# Patient Record
Sex: Male | Born: 1940 | Race: Black or African American | Hispanic: No | Marital: Single | Smoking: Former smoker
Health system: Southern US, Community
[De-identification: ages and names within clinical notes are randomized; demographics above are authoritative.]

## PROBLEM LIST (undated history)

## (undated) DIAGNOSIS — C801 Malignant (primary) neoplasm, unspecified: Secondary | ICD-10-CM

## (undated) DIAGNOSIS — I10 Essential (primary) hypertension: Secondary | ICD-10-CM

## (undated) DIAGNOSIS — K219 Gastro-esophageal reflux disease without esophagitis: Secondary | ICD-10-CM

## (undated) DIAGNOSIS — I2510 Atherosclerotic heart disease of native coronary artery without angina pectoris: Secondary | ICD-10-CM

---

## 2014-07-19 ENCOUNTER — Encounter (HOSPITAL_COMMUNITY): Payer: Self-pay | Admitting: Emergency Medicine

## 2014-07-19 ENCOUNTER — Emergency Department (HOSPITAL_COMMUNITY): Payer: Medicare Other

## 2014-07-19 ENCOUNTER — Emergency Department (HOSPITAL_COMMUNITY)
Admission: EM | Admit: 2014-07-19 | Discharge: 2014-07-19 | Disposition: A | Discharge status: Home / Self Care | Payer: Medicare Other | Attending: Emergency Medicine | Admitting: Emergency Medicine

## 2014-07-19 DIAGNOSIS — M545 Low back pain, unspecified: Secondary | ICD-10-CM

## 2014-07-19 DIAGNOSIS — I2510 Atherosclerotic heart disease of native coronary artery without angina pectoris: Secondary | ICD-10-CM | POA: Insufficient documentation

## 2014-07-19 DIAGNOSIS — R42 Dizziness and giddiness: Secondary | ICD-10-CM | POA: Insufficient documentation

## 2014-07-19 DIAGNOSIS — Z8719 Personal history of other diseases of the digestive system: Secondary | ICD-10-CM | POA: Insufficient documentation

## 2014-07-19 DIAGNOSIS — I10 Essential (primary) hypertension: Secondary | ICD-10-CM | POA: Insufficient documentation

## 2014-07-19 DIAGNOSIS — E860 Dehydration: Secondary | ICD-10-CM

## 2014-07-19 DIAGNOSIS — Z8546 Personal history of malignant neoplasm of prostate: Secondary | ICD-10-CM | POA: Insufficient documentation

## 2014-07-19 DIAGNOSIS — M549 Dorsalgia, unspecified: Secondary | ICD-10-CM | POA: Insufficient documentation

## 2014-07-19 DIAGNOSIS — Z85830 Personal history of malignant neoplasm of bone: Secondary | ICD-10-CM | POA: Insufficient documentation

## 2014-07-19 DIAGNOSIS — Z87891 Personal history of nicotine dependence: Secondary | ICD-10-CM | POA: Insufficient documentation

## 2014-07-19 DIAGNOSIS — Z8505 Personal history of malignant neoplasm of liver: Secondary | ICD-10-CM | POA: Insufficient documentation

## 2014-07-19 DIAGNOSIS — R109 Unspecified abdominal pain: Secondary | ICD-10-CM | POA: Insufficient documentation

## 2014-07-19 HISTORY — DX: Atherosclerotic heart disease of native coronary artery without angina pectoris: I25.10

## 2014-07-19 HISTORY — DX: Malignant (primary) neoplasm, unspecified: C80.1

## 2014-07-19 HISTORY — DX: Essential (primary) hypertension: I10

## 2014-07-19 HISTORY — DX: Gastro-esophageal reflux disease without esophagitis: K21.9

## 2014-07-19 LAB — URINALYSIS, ROUTINE W REFLEX MICROSCOPIC
BILIRUBIN URINE: NEGATIVE
Glucose, UA: NEGATIVE mg/dL
Hgb urine dipstick: NEGATIVE
KETONES UR: NEGATIVE mg/dL
LEUKOCYTES UA: NEGATIVE
NITRITE: NEGATIVE
PROTEIN: NEGATIVE mg/dL
Specific Gravity, Urine: 1.018 (ref 1.005–1.030)
Urobilinogen, UA: 1 mg/dL (ref 0.0–1.0)
pH: 6.5 (ref 5.0–8.0)

## 2014-07-19 LAB — COMPREHENSIVE METABOLIC PANEL
ALT: 8 U/L (ref 0–53)
AST: 34 U/L (ref 0–37)
Albumin: 2.2 g/dL — ABNORMAL LOW (ref 3.5–5.2)
Alkaline Phosphatase: 151 U/L — ABNORMAL HIGH (ref 39–117)
Anion gap: 14 (ref 5–15)
BUN: 7 mg/dL (ref 6–23)
CALCIUM: 8.8 mg/dL (ref 8.4–10.5)
CHLORIDE: 99 meq/L (ref 96–112)
CO2: 25 meq/L (ref 19–32)
Creatinine, Ser: 0.68 mg/dL (ref 0.50–1.35)
GFR calc Af Amer: >90 mL/min (ref >90)
Glucose, Bld: 98 mg/dL (ref 70–99)
Potassium: 3.7 mEq/L (ref 3.7–5.3)
Sodium: 138 mEq/L (ref 137–147)
Total Bilirubin: 0.2 mg/dL — ABNORMAL LOW (ref 0.3–1.2)
Total Protein: 7 g/dL (ref 6.0–8.3)

## 2014-07-19 LAB — CBC WITH DIFFERENTIAL/PLATELET
BASOS ABS: 0 10*3/uL (ref 0.0–0.1)
Basophils Relative: 0 % (ref 0–1)
EOS PCT: 1 % (ref 0–5)
Eosinophils Absolute: 0 10*3/uL (ref 0.0–0.7)
HEMATOCRIT: 30.2 % — AB (ref 39.0–52.0)
HEMOGLOBIN: 9.6 g/dL — AB (ref 13.0–17.0)
Lymphocytes Relative: 27 % (ref 12–46)
Lymphs Abs: 1.4 10*3/uL (ref 0.7–4.0)
MCH: 26.6 pg (ref 26.0–34.0)
MCHC: 31.8 g/dL (ref 30.0–36.0)
MCV: 83.7 fL (ref 78.0–100.0)
MONO ABS: 0.4 10*3/uL (ref 0.1–1.0)
Monocytes Relative: 8 % (ref 3–12)
Neutro Abs: 3.2 10*3/uL (ref 1.7–7.7)
Neutrophils Relative %: 64 % (ref 43–77)
Platelets: 322 10*3/uL (ref 150–400)
RBC: 3.61 MIL/uL — ABNORMAL LOW (ref 4.22–5.81)
RDW: 15.7 % — AB (ref 11.5–15.5)
WBC: 5.1 10*3/uL (ref 4.0–10.5)

## 2014-07-19 LAB — I-STAT TROPONIN, ED: Troponin i, poc: 0.06 ng/mL (ref 0.00–0.08)

## 2014-07-19 LAB — LIPASE, BLOOD: LIPASE: 22 U/L (ref 11–59)

## 2014-07-19 MED ORDER — SODIUM CHLORIDE 0.9 % IV BOLUS (SEPSIS)
1000.0000 mL | Freq: Once | INTRAVENOUS | Status: AC
  Administered 2014-07-19: 1000 mL via INTRAVENOUS

## 2014-07-19 MED ORDER — HYDROCODONE-ACETAMINOPHEN 5-325 MG PO TABS: 1.0000 | ORAL_TABLET | ORAL | Status: AC | PRN

## 2014-07-19 MED ORDER — HYDROCODONE-ACETAMINOPHEN 5-325 MG PO TABS
1.0000 | ORAL_TABLET | Freq: Once | ORAL | Status: AC
  Administered 2014-07-19: 1 via ORAL
  Filled 2014-07-19: qty 1

## 2014-07-19 NOTE — ED Notes (Signed)
IV team at bedside 

## 2014-07-19 NOTE — ED Notes (Signed)
Pt c/o lower back pain x 3 days. Pt also c/o weakness and slight dizziness thinks its because he has not had a BM in 4 days. Denies n/v

## 2014-07-19 NOTE — Discharge Instructions (Signed)
Please continue your regular medications. Take norco for severe pain as prescribed as needed. Follow up with the wellness center as referred. Return if symptoms are worsening.   Dehydration, Adult Dehydration is when you lose more fluids from the body than you take in. Vital organs like the kidneys, brain, and heart cannot function without a proper amount of fluids and salt. Any loss of fluids from the body can cause dehydration.  CAUSES   Vomiting.  Diarrhea.  Excessive sweating.  Excessive urine output.  Fever. SYMPTOMS  Mild dehydration  Thirst.  Dry lips.  Slightly dry mouth. Moderate dehydration  Very dry mouth.  Sunken eyes.  Skin does not bounce back quickly when lightly pinched and released.  Dark urine and decreased urine production.  Decreased tear production.  Headache. Severe dehydration  Very dry mouth.  Extreme thirst.  Rapid, weak pulse (more than 100 beats per minute at rest).  Cold hands and feet.  Not able to sweat in spite of heat and temperature.  Rapid breathing.  Blue lips.  Confusion and lethargy.  Difficulty being awakened.  Minimal urine production.  No tears. DIAGNOSIS  Your caregiver will diagnose dehydration based on your symptoms and your exam. Blood and urine tests will help confirm the diagnosis. The diagnostic evaluation should also identify the cause of dehydration. TREATMENT  Treatment of mild or moderate dehydration can often be done at home by increasing the amount of fluids that you drink. It is best to drink small amounts of fluid more often. Drinking too much at one time can make vomiting worse. Refer to the home care instructions below. Severe dehydration needs to be treated at the hospital where you will probably be given intravenous (IV) fluids that contain water and electrolytes. HOME CARE INSTRUCTIONS   Ask your caregiver about specific rehydration instructions.  Drink enough fluids to keep your urine  clear or pale yellow.  Drink small amounts frequently if you have nausea and vomiting.  Eat as you normally do.  Avoid:  Foods or drinks high in sugar.  Carbonated drinks.  Juice.  Extremely hot or cold fluids.  Drinks with caffeine.  Fatty, greasy foods.  Alcohol.  Tobacco.  Overeating.  Gelatin desserts.  Wash your hands well to avoid spreading bacteria and viruses.  Only take over-the-counter or prescription medicines for pain, discomfort, or fever as directed by your caregiver.  Ask your caregiver if you should continue all prescribed and over-the-counter medicines.  Keep all follow-up appointments with your caregiver. SEEK MEDICAL CARE IF:  You have abdominal pain and it increases or stays in one area (localizes).  You have a rash, stiff neck, or severe headache.  You are irritable, sleepy, or difficult to awaken.  You are weak, dizzy, or extremely thirsty. SEEK IMMEDIATE MEDICAL CARE IF:   You are unable to keep fluids down or you get worse despite treatment.  You have frequent episodes of vomiting or diarrhea.  You have blood or green matter (bile) in your vomit.  You have blood in your stool or your stool looks black and tarry.  You have not urinated in 6 to 8 hours, or you have only urinated a small amount of very dark urine.  You have a fever.  You faint. MAKE SURE YOU:   Understand these instructions.  Will watch your condition.  Will get help right away if you are not doing well or get worse. Document Released: 12/02/2005 Document Revised: 02/24/2012 Document Reviewed: 07/22/2011 [employer] Patient Information 2015 [employer]. This information is not intended to replace advice given to you by your health care provider. Make sure you discuss any questions you have with your health care provider.

## 2014-07-19 NOTE — ED Provider Notes (Signed)
CSN: [id number]     Arrival date & time 07/19/14  1041 History   First MD Initiated Contact with Patient 07/19/14 1101     Chief Complaint  Patient presents with  . Weakness  . Dizziness  . Back Pain     (Consider location/radiation/quality/duration/timing/severity/associated sxs/prior Treatment) HPI Barry Macias is a 73 y.o. male who presents to ED complaining of dizziness, weakness, back pain. Pt just moved here from [place], currently staying with a friend. Pt reports hx of CAD, metastatic ca. From records that he has with him, pt had PCI 2 months ago, and was just discharged 5 days ago after being admitted for chest pain. Stress test performed and was negative, CT angio was done which showed metastatic lesions in thoracic spine, ribs, liver. Pt states he is currently not taking any medications, even his heart medications that he was discharged on. He reports poor appetite. States dizziness feels like near syncope and worsened with standing. Denies any fever, chills, n/v/d. Intermittent epigastric pain, hx of gerd. Pt also reports hx of constipation, last BM 4 days ago.   Past Medical History  Diagnosis Date  . Hypertension   . CAD (coronary artery disease)   . GERD (gastroesophageal reflux disease)   . Cancer     metastasis to bone   History reviewed. No pertinent past surgical history. No family history on file. History  Substance Use Topics  . Smoking status: Former Research scientist (life sciences)  . Smokeless tobacco: Not on file  . Alcohol Use: No    Review of Systems  Constitutional: Negative for fever and chills.  Respiratory: Negative for cough, chest tightness and shortness of breath.   Cardiovascular: Negative for chest pain, palpitations and leg swelling.  Gastrointestinal: Positive for abdominal pain. Negative for nausea, vomiting, diarrhea and abdominal distention.  Genitourinary: Negative for dysuria, urgency, frequency and hematuria.  Musculoskeletal: Positive for back pain. Negative for  arthralgias, myalgias, neck pain and neck stiffness.  Skin: Negative for rash.  Allergic/Immunologic: Negative for immunocompromised state.  Neurological: Positive for dizziness and light-headedness. Negative for weakness, numbness and headaches.      Allergies  Review of patient's allergies indicates no known allergies.  Home Medications   Prior to Admission medications   Not on File   BP 119/60  Pulse 110  Temp(Src) 98.5 F (36.9 C) (Oral)  Resp 20  SpO2 99% Physical Exam  Nursing note and vitals reviewed. Constitutional: He appears well-developed and well-nourished. No distress.  HENT:  Head: Normocephalic and atraumatic.  Oral mucosa dry  Eyes: Conjunctivae and EOM are normal. Pupils are equal, round, and reactive to light.  Neck: Normal range of motion. Neck supple.  Cardiovascular: Normal rate, regular rhythm and normal heart sounds.   Pulmonary/Chest: Effort normal. No respiratory distress. He has no wheezes. He has no rales.  Abdominal: Soft. Bowel sounds are normal. He exhibits no distension and no mass. There is no tenderness. There is no rebound and no guarding.  Musculoskeletal: He exhibits no edema.  Left perispinal lumbar tenderness. No midline thoracic or lumbar spine tenderness. Tender in left Si joint. No pain with bilateral straight leg raise .  Neurological: He is alert.  5/5 and equal lower extremity strength. 2+ and equal patellar reflexes bilaterally. Pt able to dorsiflex bilateral toes and feet with good strength against resistance. Equal sensation bilaterally over thighs and lower legs.   Skin: Skin is warm and dry.    ED Course  Procedures (including critical care time) Labs Review Labs  Reviewed  CBC WITH DIFFERENTIAL - Abnormal; Notable for the following:    RBC 3.61 (*)    Hemoglobin 9.6 (*)    HCT 30.2 (*)    RDW 15.7 (*)    All other components within normal limits  COMPREHENSIVE METABOLIC PANEL - Abnormal; Notable for the following:     Albumin 2.2 (*)    Alkaline Phosphatase 151 (*)    Total Bilirubin 0.2 (*)    All other components within normal limits  URINALYSIS, ROUTINE W REFLEX MICROSCOPIC - Abnormal; Notable for the following:    Color, Urine AMBER (*)    All other components within normal limits  LIPASE, BLOOD  I-STAT TROPOININ, ED    Imaging Review Dg Abd Acute W/chest  07/19/2014   CLINICAL DATA:  Pain.  EXAM: ACUTE ABDOMEN SERIES (ABDOMEN 2 VIEW & CHEST 1 VIEW)  COMPARISON:  None.  FINDINGS: No acute cardiopulmonary disease. Soft tissue structures are unremarkable. Scattered air-fluid levels are noted throughout the abdomen. Air-filled minimally prominent loops of small bowel are noted. Colonic gas pattern is nonspecific. No free air. Calcifications within the pelvis consistent with phleboliths. Degenerative changes lumbar spine and both hips.  IMPRESSION: 1. Scattered air-fluid levels with mild small bowel prominence. Colonic gas pattern nonspecific. Findings are most consistent with mild adynamic ileus. Follow-up abdominal series to exclude developing small bowel obstruction should be considered. 2. No acute cardiopulmonary disease.   Electronically Signed   By: Marcello Moores  Register   On: 07/19/2014 12:51     EKG Interpretation   Date/Time:  Tuesday July 19 2014 11:17:34 EDT Ventricular Rate:  103 PR Interval:  117 QRS Duration: 99 QT Interval:  348 QTC Calculation: 455 R Axis:   40 Text Interpretation:  Sinus tachycardia Probable anteroseptal infarct, old  Baseline wander in lead(s) V1 No old tracing to compare Confirmed by [facility]   MD, ELLIOTT ([id number]) on 07/19/2014 11:55:23 AM      MDM   Final diagnoses:  Left-sided low back pain without sciatica  Dehydration   Patient with back pain, dizziness, weakness. Based on the records patient has metastatic disease from what is it sounds like prostate cancer, metastases to the bone and liver. Patient chose not to receive any treatment at this time. Pt appears  dehydrated. Will start IV fluids, check orthostatics, labs   3:20 PM Pt was orthostatic. Received iv fluids. Feeling better. Labs unremarkable other than anemia. Recommended iron supplements. Pt is afebrile, no evidence of infection.  Abdomen soft, non tender at this time. Suspect back pain is muscular given his metastatic disease. No signs of cauda equina. Discussed with Dr. Eulis Foster who has seen pt. Will start on pain medications. Follow up with wellness center here in [place]. Return precautions discussed.   Filed Vitals:   07/19/14 1053 07/19/14 1215 07/19/14 1216 07/19/14 1333  BP: 119/60 141/65 103/51 140/73  Pulse: 110 104 113 98  Temp: 98.5 F (36.9 C)     TempSrc: Oral     Resp: 20 20 16 16   SpO2: 99% 100% 100%       Renold Genta, PA-C 07/19/14 1522

## 2014-07-19 NOTE — ED Notes (Signed)
Attempted to place IV X 2-IV RN called

## 2014-07-19 NOTE — ED Provider Notes (Signed)
  Face-to-face evaluation   History: Presents for evaluation of back pain. He has known metastatic cancer. He is in [place], now, staying with a friend and is not sure if he is her long-term or not.    Physical exam: Alert, cooperative, he is cachectic. Respiratory- no distress.  Medical screening examination/treatment/procedure(s) were conducted as a shared visit with non-physician practitioner(s) and myself.  I personally evaluated the patient during the encounter  Richarda Blade, MD 07/19/14 [phone]

## 2014-08-08 ENCOUNTER — Encounter (HOSPITAL_COMMUNITY): Payer: Self-pay | Admitting: Emergency Medicine

## 2014-08-08 DIAGNOSIS — R143 Flatulence: Principal | ICD-10-CM

## 2014-08-08 DIAGNOSIS — Z87891 Personal history of nicotine dependence: Secondary | ICD-10-CM | POA: Insufficient documentation

## 2014-08-08 DIAGNOSIS — Z79899 Other long term (current) drug therapy: Secondary | ICD-10-CM | POA: Insufficient documentation

## 2014-08-08 DIAGNOSIS — Z85830 Personal history of malignant neoplasm of bone: Secondary | ICD-10-CM | POA: Insufficient documentation

## 2014-08-08 DIAGNOSIS — Z7901 Long term (current) use of anticoagulants: Secondary | ICD-10-CM | POA: Insufficient documentation

## 2014-08-08 DIAGNOSIS — K5900 Constipation, unspecified: Secondary | ICD-10-CM | POA: Insufficient documentation

## 2014-08-08 DIAGNOSIS — I2510 Atherosclerotic heart disease of native coronary artery without angina pectoris: Secondary | ICD-10-CM | POA: Insufficient documentation

## 2014-08-08 DIAGNOSIS — R141 Gas pain: Secondary | ICD-10-CM | POA: Insufficient documentation

## 2014-08-08 DIAGNOSIS — Z8546 Personal history of malignant neoplasm of prostate: Secondary | ICD-10-CM | POA: Insufficient documentation

## 2014-08-08 DIAGNOSIS — R142 Eructation: Principal | ICD-10-CM

## 2014-08-08 DIAGNOSIS — I10 Essential (primary) hypertension: Secondary | ICD-10-CM | POA: Insufficient documentation

## 2014-08-08 NOTE — ED Notes (Signed)
Patient here with complaint of constipation which has been ongoing for over a month. States that the constipation has been getting worse over the past 4 days. Explains that he has been using 1 bottle of Mag citrate/day with positive effect but the problem comes right back. States only able to eat small amount before feeling full. Denies nausea, vomiting, chest pain, abdominal pain, shortness of breath, and fever.

## 2014-08-09 ENCOUNTER — Emergency Department (HOSPITAL_COMMUNITY)
Admission: EM | Admit: 2014-08-09 | Discharge: 2014-08-09 | Disposition: A | Discharge status: Home / Self Care | Payer: Medicare Other | Attending: Emergency Medicine | Admitting: Emergency Medicine

## 2014-08-09 DIAGNOSIS — R140 Abdominal distension (gaseous): Secondary | ICD-10-CM

## 2014-08-09 LAB — URINALYSIS, ROUTINE W REFLEX MICROSCOPIC
Bilirubin Urine: NEGATIVE
Glucose, UA: NEGATIVE mg/dL
Hgb urine dipstick: NEGATIVE
KETONES UR: 15 mg/dL — AB
LEUKOCYTES UA: NEGATIVE
Nitrite: NEGATIVE
PROTEIN: 30 mg/dL — AB
Specific Gravity, Urine: 1.024 (ref 1.005–1.030)
Urobilinogen, UA: 0.2 mg/dL (ref 0.0–1.0)
pH: 6 (ref 5.0–8.0)

## 2014-08-09 LAB — URINE MICROSCOPIC-ADD ON

## 2014-08-09 LAB — COMPREHENSIVE METABOLIC PANEL
ALT: 12 U/L (ref 0–53)
AST: 38 U/L — ABNORMAL HIGH (ref 0–37)
Albumin: 2.3 g/dL — ABNORMAL LOW (ref 3.5–5.2)
Alkaline Phosphatase: 280 U/L — ABNORMAL HIGH (ref 39–117)
Anion gap: 14 (ref 5–15)
BUN: 10 mg/dL (ref 6–23)
CALCIUM: 8.7 mg/dL (ref 8.4–10.5)
CO2: 25 meq/L (ref 19–32)
CREATININE: 0.75 mg/dL (ref 0.50–1.35)
Chloride: 96 mEq/L (ref 96–112)
GFR calc non Af Amer: >90 mL/min (ref >90)
Glucose, Bld: 111 mg/dL — ABNORMAL HIGH (ref 70–99)
Potassium: 4.4 mEq/L (ref 3.7–5.3)
SODIUM: 135 meq/L — AB (ref 137–147)
TOTAL PROTEIN: 7.7 g/dL (ref 6.0–8.3)
Total Bilirubin: 0.3 mg/dL (ref 0.3–1.2)

## 2014-08-09 LAB — CBC WITH DIFFERENTIAL/PLATELET
Basophils Absolute: 0 10*3/uL (ref 0.0–0.1)
Basophils Relative: 0 % (ref 0–1)
EOS ABS: 0 10*3/uL (ref 0.0–0.7)
EOS PCT: 0 % (ref 0–5)
HEMATOCRIT: 30.1 % — AB (ref 39.0–52.0)
Hemoglobin: 9.6 g/dL — ABNORMAL LOW (ref 13.0–17.0)
LYMPHS PCT: 28 % (ref 12–46)
Lymphs Abs: 1.5 10*3/uL (ref 0.7–4.0)
MCH: 26 pg (ref 26.0–34.0)
MCHC: 31.9 g/dL (ref 30.0–36.0)
MCV: 81.6 fL (ref 78.0–100.0)
MONO ABS: 0.4 10*3/uL (ref 0.1–1.0)
Monocytes Relative: 8 % (ref 3–12)
Neutro Abs: 3.4 10*3/uL (ref 1.7–7.7)
Neutrophils Relative %: 64 % (ref 43–77)
PLATELETS: 288 10*3/uL (ref 150–400)
RBC: 3.69 MIL/uL — ABNORMAL LOW (ref 4.22–5.81)
RDW: 17.2 % — AB (ref 11.5–15.5)
WBC: 5.3 10*3/uL (ref 4.0–10.5)

## 2014-08-09 LAB — LIPASE, BLOOD: Lipase: 14 U/L (ref 11–59)

## 2014-08-09 MED ORDER — SODIUM CHLORIDE 0.9 % IV BOLUS (SEPSIS)
1000.0000 mL | Freq: Once | INTRAVENOUS | Status: AC
  Administered 2014-08-09: 1000 mL via INTRAVENOUS

## 2014-08-09 NOTE — ED Notes (Signed)
The patient is unable to give an urine specimen at this time. The tech has reported to the RN in charge. 

## 2014-08-09 NOTE — ED Provider Notes (Signed)
CSN: [id number]     Arrival date & time 08/08/14  2135 History   First MD Initiated Contact with Patient 08/09/14 0203     Chief Complaint  Patient presents with  . Constipation  . Bloated     (Consider location/radiation/quality/duration/timing/severity/associated sxs/prior Treatment) HPI  Mr. 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 Patient recently moved here from [place] and has not established care with an oncologist. This is currently pending at [facility] upon approval of his insurance.  Patient states he can only eat very small meals before feeling bloated.  Patient denies any nausea or vomiting. He does have moderate abdominal pain after meals and early satiety.  He denies any changes in his stool or urine. He denies any fevers recent infections chest pain or shortness of breath. Patient states he also takes MiraLAX every day to help with his bowel movements.  Past Medical History  Diagnosis Date  . Hypertension   . CAD (coronary artery disease)   . GERD (gastroesophageal reflux disease)   . Cancer     metastasis to bone   History reviewed. No pertinent past surgical history. History reviewed. No pertinent family history. History  Substance Use Topics  . Smoking status: Former Research scientist (life sciences)  . Smokeless tobacco: Not on file  . Alcohol Use: No    Review of Systems 10 Systems reviewed and are negative for acute change except as noted in the HPI.    Allergies  Review of patient's allergies indicates no known allergies.  Home Medications   Prior to Admission medications   Medication Sig Start Date End Date Taking? Authorizing Provider  atorvastatin (LIPITOR) 40 MG tablet Take 40 mg by mouth daily.    Historical Provider, MD  clopidogrel (PLAVIX) 75 MG tablet Take 75 mg by mouth daily.    Historical Provider, MD  docusate sodium (COLACE) 100 MG capsule Take 100 mg  by mouth daily.    Historical Provider, MD  HYDROcodone-acetaminophen (NORCO/VICODIN) 5-325 MG per tablet Take 1 tablet by mouth every 4 (four) hours as needed for moderate pain or severe pain. 07/19/14   Tatyana A Kirichenko, PA-C  polyethylene glycol (MIRALAX / GLYCOLAX) packet Take 17 g by mouth daily as needed for moderate constipation.    Historical Provider, MD  senna (SENOKOT) 8.6 MG tablet Take 1 tablet by mouth daily.    Historical Provider, MD   BP 132/67  Pulse 99  Temp(Src) 98 F (36.7 C) (Oral)  Resp 17  SpO2 99% Physical Exam  Nursing note and vitals reviewed. Constitutional: He is oriented to person, place, and time. Vital signs are normal.  Non-toxic appearance. He does not appear ill. No distress.  HENT:  Head: Normocephalic and atraumatic.  Nose: Nose normal.  Mouth/Throat: Oropharynx is clear and moist. No oropharyngeal exudate.  Eyes: Conjunctivae and EOM are normal. Pupils are equal, round, and reactive to light. No scleral icterus.  Neck: Normal range of motion. Neck supple. No tracheal deviation, no edema, no erythema and normal range of motion present. No mass and no thyromegaly present.  Cardiovascular: Normal rate, regular rhythm, S1 normal, S2 normal, normal heart sounds, intact distal pulses and normal pulses.  Exam reveals no gallop and no friction rub.   No murmur heard. Pulses:      Radial pulses are 2+ on the right side, and 2+ on the left side.  Dorsalis pedis pulses are 2+ on the right side, and 2+ on the left side.  Pulmonary/Chest: Effort normal and breath sounds normal. No respiratory distress. He has no wheezes. He has no rhonchi. He has no rales.  Abdominal: Soft. Normal appearance and bowel sounds are normal. He exhibits no distension, no ascites and no mass. There is no hepatosplenomegaly. There is no tenderness. There is no rebound, no guarding and no CVA tenderness.  Musculoskeletal: Normal range of motion. He exhibits no edema and no  tenderness.  Lymphadenopathy:    He has no cervical adenopathy.  Neurological: He is alert and oriented to person, place, and time. He has normal strength. No cranial nerve deficit or sensory deficit. GCS eye subscore is 4. GCS verbal subscore is 5. GCS motor subscore is 6.  Skin: Skin is warm, dry and intact. No petechiae and no rash noted. He is not diaphoretic. No erythema. No pallor.  Psychiatric: He has a normal mood and affect. His behavior is normal. Judgment normal.    ED Course  Procedures (including critical care time) Labs Review Labs Reviewed  CBC WITH DIFFERENTIAL - Abnormal; Notable for the following:    RBC 3.69 (*)    Hemoglobin 9.6 (*)    HCT 30.1 (*)    RDW 17.2 (*)    All other components within normal limits  COMPREHENSIVE METABOLIC PANEL - Abnormal; Notable for the following:    Sodium 135 (*)    Glucose, Bld 111 (*)    Albumin 2.3 (*)    AST 38 (*)    Alkaline Phosphatase 280 (*)    All other components within normal limits  URINALYSIS, ROUTINE W REFLEX MICROSCOPIC - Abnormal; Notable for the following:    Color, Urine AMBER (*)    Ketones, ur 15 (*)    Protein, ur 30 (*)    All other components within normal limits  URINE MICROSCOPIC-ADD ON - Abnormal; Notable for the following:    Squamous Epithelial / LPF FEW (*)    Bacteria, UA FEW (*)    All other components within normal limits  LIPASE, BLOOD    Imaging Review No results found.   EKG Interpretation None      MDM   Final diagnoses:  None   Patient has new onset bloody for the past 2 months. In the setting of his metastatic prostate cancer, I believe this is a sequelae of his prior diagnosis. He is denying any vomiting fevers. Patient did not appear clinically dehydrated.  Patient was given a liter of IV fluids and we will check labs on him to ensure there are no electrolyte abnormalities.  Patient's tachycardia has resolved and his vital signs remained stable.  Patient's hemoglobin is low  but at baseline. Patient was educated on the [place] of cancer. He is advised to followup with [facility] oncology. Patient safe for discharge.  Everlene Balls, MD 08/09/14 [phone]

## 2014-08-09 NOTE — Discharge Instructions (Signed)
Bloating Barry Macias, you were seen today for abdominal pain and bloating.  This is likely secondary to your metastatic prostate cancer.  Follow up with an oncologist for continued care.  If your symptoms worsen, or you become dehydrated, return to the ED for repeat evaluation.  Thank you. Bloating is the feeling of fullness in your belly. You may feel as though your pants are too tight. Often the cause of bloating is overeating, retaining fluids, or having gas in your bowel. It is also caused by swallowing air and eating foods that cause gas. Irritable bowel syndrome is one of the most common causes of bloating. Constipation is also a common cause. Sometimes more serious problems can cause bloating. SYMPTOMS  Usually there is a feeling of fullness, as though your abdomen is bulged out. There may be mild discomfort.  DIAGNOSIS  Usually no particular testing is necessary for most bloating. If the condition persists and seems to become worse, your caregiver may do additional testing.  TREATMENT   There is no direct treatment for bloating.  Do not put gas into the bowel. Avoid chewing gum and sucking on candy. These tend to make you swallow air. Swallowing air can also be a nervous habit. Try to avoid this.  Avoiding high residue diets will help. Eat foods with soluble fibers (examples include root vegetables, apples, or barley) and substitute dairy products with soy and rice products. This helps irritable bowel syndrome.  If constipation is the cause, then a high residue diet with more fiber will help.  Avoid carbonated beverages.  Over-the-counter preparations are available that help reduce gas. Your pharmacist can help you with this. SEEK MEDICAL CARE IF:   Bloating continues and seems to be getting worse.  You notice a weight gain.  You have a weight loss but the bloating is getting worse.  You have changes in your bowel habits or develop nausea or vomiting. SEEK IMMEDIATE MEDICAL  CARE IF:   You develop shortness of breath or swelling in your legs.  You have an increase in abdominal pain or develop chest pain. Document Released: 10/02/2006 Document Revised: 02/24/2012 Document Reviewed: 11/20/2007 [facility] Patient Information 2015 [place], [place]. This information is not intended to replace advice given to you by your health care provider. Make sure you discuss any questions you have with your health care provider.

## 2014-08-10 ENCOUNTER — Telehealth: Payer: Self-pay | Admitting: Oncology

## 2014-08-10 ENCOUNTER — Other Ambulatory Visit: Payer: Self-pay | Admitting: Oncology

## 2014-08-10 DIAGNOSIS — C61 Malignant neoplasm of prostate: Secondary | ICD-10-CM

## 2014-08-10 NOTE — Telephone Encounter (Signed)
S/W PT'S FRIEND ALICE IN REF TO NP APPT. ON 08/17/14@10 :30 DX-METASTATIC PROSTATE CA

## 2014-08-10 NOTE — Telephone Encounter (Signed)
C/D 08/10/14 for appt. 08/17/14

## 2014-08-17 ENCOUNTER — Ambulatory Visit (HOSPITAL_BASED_OUTPATIENT_CLINIC_OR_DEPARTMENT_OTHER): Payer: Medicare Other | Admitting: Oncology

## 2014-08-17 ENCOUNTER — Encounter: Payer: Self-pay | Admitting: Oncology

## 2014-08-17 ENCOUNTER — Other Ambulatory Visit: Payer: Self-pay | Admitting: *Deleted

## 2014-08-17 ENCOUNTER — Ambulatory Visit: Payer: Medicare Other

## 2014-08-17 ENCOUNTER — Telehealth: Payer: Self-pay | Admitting: *Deleted

## 2014-08-17 ENCOUNTER — Other Ambulatory Visit (HOSPITAL_BASED_OUTPATIENT_CLINIC_OR_DEPARTMENT_OTHER): Payer: Medicare Other

## 2014-08-17 ENCOUNTER — Telehealth: Payer: Self-pay | Admitting: Oncology

## 2014-08-17 VITALS — BP 110/59 | HR 112 | Resp 19 | Ht 72.0 in | Wt 121.2 lb

## 2014-08-17 DIAGNOSIS — C61 Malignant neoplasm of prostate: Secondary | ICD-10-CM

## 2014-08-17 DIAGNOSIS — K5900 Constipation, unspecified: Secondary | ICD-10-CM

## 2014-08-17 DIAGNOSIS — R634 Abnormal weight loss: Secondary | ICD-10-CM

## 2014-08-17 DIAGNOSIS — C801 Malignant (primary) neoplasm, unspecified: Secondary | ICD-10-CM

## 2014-08-17 DIAGNOSIS — R64 Cachexia: Secondary | ICD-10-CM

## 2014-08-17 DIAGNOSIS — R630 Anorexia: Secondary | ICD-10-CM

## 2014-08-17 LAB — COMPREHENSIVE METABOLIC PANEL (CC13)
ALBUMIN: 2.2 g/dL — AB (ref 3.5–5.0)
ALK PHOS: 265 U/L — AB (ref 40–150)
ALT: 14 U/L (ref 0–55)
AST: 29 U/L (ref 5–34)
Anion Gap: 12 mEq/L — ABNORMAL HIGH (ref 3–11)
BUN: 7.3 mg/dL (ref 7.0–26.0)
CO2: 24 mEq/L (ref 22–29)
CREATININE: 0.8 mg/dL (ref 0.7–1.3)
Calcium: 9.1 mg/dL (ref 8.4–10.4)
Chloride: 105 mEq/L (ref 98–109)
GLUCOSE: 90 mg/dL (ref 70–140)
POTASSIUM: 3.9 meq/L (ref 3.5–5.1)
Sodium: 140 mEq/L (ref 136–145)
Total Bilirubin: 0.22 mg/dL (ref 0.20–1.20)
Total Protein: 7.4 g/dL (ref 6.4–8.3)

## 2014-08-17 LAB — CBC WITH DIFFERENTIAL/PLATELET
BASO%: 0.2 % (ref 0.0–2.0)
BASOS ABS: 0 10*3/uL (ref 0.0–0.1)
EOS%: 1 % (ref 0.0–7.0)
Eosinophils Absolute: 0.1 10*3/uL (ref 0.0–0.5)
HCT: 31.7 % — ABNORMAL LOW (ref 38.4–49.9)
HEMOGLOBIN: 9.7 g/dL — AB (ref 13.0–17.1)
LYMPH%: 44.5 % (ref 14.0–49.0)
MCH: 25.5 pg — ABNORMAL LOW (ref 27.2–33.4)
MCHC: 30.6 g/dL — ABNORMAL LOW (ref 32.0–36.0)
MCV: 83.4 fL (ref 79.3–98.0)
MONO#: 0.3 10*3/uL (ref 0.1–0.9)
MONO%: 5.9 % (ref 0.0–14.0)
NEUT#: 2.5 10*3/uL (ref 1.5–6.5)
NEUT%: 48.4 % (ref 39.0–75.0)
PLATELETS: 405 10*3/uL — AB (ref 140–400)
RBC: 3.8 10*6/uL — ABNORMAL LOW (ref 4.20–5.82)
RDW: 17.3 % — AB (ref 11.0–14.6)
WBC: 5.1 10*3/uL (ref 4.0–10.3)
lymph#: 2.3 10*3/uL (ref 0.9–3.3)

## 2014-08-17 MED ORDER — MEGESTROL ACETATE 400 MG/10ML PO SUSP: 400.0000 mg | Freq: Two times a day (BID) | ORAL | Status: AC

## 2014-08-17 MED ORDER — LACTULOSE 10 GM/15ML PO SOLN: 10.0000 g | Freq: Three times a day (TID) | ORAL | Status: AC | PRN

## 2014-08-17 MED ORDER — LACTULOSE 10 G PO PACK: PACK | ORAL | Status: DC

## 2014-08-17 NOTE — Telephone Encounter (Signed)
Pt saw Dr. Alen Blew today, and was given prescription for Lactulose 10g packet.  Received call from [facility] stating that they only have Lactulose 10gm/32ml solution.  Dr. Alen Blew notified.  OK to change to Lactulose solution.  Called [facility] and informed her of same info. Michelle[facility]  Phone   [phone].

## 2014-08-17 NOTE — Progress Notes (Signed)
Checked in new patient with no financial issues prior to seeing the dr. He has applied for medicare and has not recd the card yet. They know he is self pay until the card is entered. He also waiting on his picture id from dmv-he can't drive.

## 2014-08-17 NOTE — Consult Note (Signed)
Reason for Referral: Advanced malignancy.   HPI: Barry Macias is a pleasant 73 year old African American gentleman [place] lived in multiple places. He the majority of his life in [place] but relocated briefly to [place] and now lives in [place]. She looks like she has a history of coronary disease and hyperlipidemia but not clearly seeks medical care routinely. I have very limited records to go by, but apparently he was hospitalized to briefly in [facility] hernia with symptoms of chest pain and shortness of breath. He did have a negative stress test and had a CT scan of the chest on 07/08/2014 which showed metastatic lesions to the thoracic spine and ribs. He was told at that time that he has metastatic prostate cancer although he denied any specific biopsy or an evaluation by a urologist. He did not seek any attention to that problem and what was relocated to this area. He is currently living with a friend who is helping with his care. Says his relocation, he has been doing poorly. He has been reporting problem with constipation and poor by mouth intake. He was also noting some bloating and dehydration. He was evaluated in the emergency department on multiple occasions in the month of August most recently on 08/09/2014. He was given IV hydration and abdominal x-ray on 07/19/2014 did not show any obstruction. He was referred to me for evaluation regarding his presumed cancer.  Clinically, he has been doing poorly overall. He lost close to 50 pounds but does not report any fevers chills or sweats. He has not had any headaches or blurry vision or syncope. His mobility is limited but is able to walk around with a cane but he is a fall risk. He is also reporting any chest pain or palpitations or any leg edema. Does not report any shortness of breath, cough or hemoptysis. He did not report any nausea, vomiting, abdominal pain but does report constipation. He does report urinary symptoms including  retention and dysuria at times. He does not report any lymphadenopathy or petechiae. He does report diffuse bone pain predominantly in his hips and his back. Rest of his review of systems unremarkable.   Past Medical History  Diagnosis Date  . Hypertension   . CAD (coronary artery disease)   . GERD (gastroesophageal reflux disease)   . Cancer     metastasis to bone  :  No past surgical history on file.:   Current Outpatient Prescriptions  Medication Sig Dispense Refill  . atorvastatin (LIPITOR) 40 MG tablet Take 40 mg by mouth daily.      . clopidogrel (PLAVIX) 75 MG tablet Take 75 mg by mouth daily.      Marland Kitchen docusate sodium (COLACE) 100 MG capsule Take 100 mg by mouth daily.      Marland Kitchen HYDROcodone-acetaminophen (NORCO/VICODIN) 5-325 MG per tablet Take 1 tablet by mouth every 4 (four) hours as needed for moderate pain or severe pain.  20 tablet  0  . magnesium citrate SOLN Take 1 Bottle by mouth daily.      . polyethylene glycol (MIRALAX / GLYCOLAX) packet Take 17 g by mouth daily as needed for moderate constipation.      . senna (SENOKOT) 8.6 MG tablet Take 1 tablet by mouth daily.      Marland Kitchen lactulose (CEPHULAC) 10 G packet Use three times a day as needed for constipation.  30 each  0  . megestrol (MEGACE) 400 MG/10ML suspension Take 10 mLs (400 mg total) by mouth 2 (two)  times daily.  240 mL  0   No current facility-administered medications for this visit.      No Known Allergies:  No family history on file.:  History   Social History  . Marital Status: Single    Spouse Name: N/A    Number of Children: N/A  . Years of Education: N/A   Occupational History  . Not on file.   Social History Main Topics  . Smoking status: Former Research scientist (life sciences)  . Smokeless tobacco: Not on file  . Alcohol Use: No  . Drug Use: No  . Sexual Activity: Not on file   Other Topics Concern  . Not on file   Social History Narrative  . No narrative on file  :  Pertinent items are noted in HPI.  Exam:  ECOG 3 Blood pressure 110/59, pulse 112, resp. rate 19, height 6' (1.829 m), weight 121 lb 3.2 oz (54.976 kg), SpO2 100.00%. General appearance: alert and cooperative cachectic appearing. Head: Normocephalic, without obvious abnormality Throat: lips, mucosa, and tongue normal; teeth and gums normal Neck: no adenopathy Back: symmetric, no curvature. ROM normal. No CVA tenderness. Resp: clear to auscultation bilaterally Chest wall: no tenderness Cardio: regular rate and rhythm, S1, S2 normal, no murmur, click, rub or gallop GI: soft, non-tender; bowel sounds normal; no masses,  no organomegaly Extremities: extremities normal, atraumatic, no cyanosis or edema Pulses: 2+ and symmetric Skin: Skin color, texture, turgor normal. No rashes or lesions Lymph nodes: Cervical, supraclavicular, and axillary nodes normal. Neurologic: Grossly normal   Recent Labs  08/17/14 1038  WBC 5.1  HGB 9.7*  HCT 31.7*  PLT 405*    Recent Labs  08/17/14 1039  NA 140  K 3.9  CO2 24  GLUCOSE 90  BUN 7.3  CREATININE 0.8  CALCIUM 9.1      Dg Abd Acute W/chest  07/19/2014   CLINICAL DATA:  Pain.  EXAM: ACUTE ABDOMEN SERIES (ABDOMEN 2 VIEW & CHEST 1 VIEW)  COMPARISON:  None.  FINDINGS: No acute cardiopulmonary disease. Soft tissue structures are unremarkable. Scattered air-fluid levels are noted throughout the abdomen. Air-filled minimally prominent loops of small bowel are noted. Colonic gas pattern is nonspecific. No free air. Calcifications within the pelvis consistent with phleboliths. Degenerative changes lumbar spine and both hips.  IMPRESSION: 1. Scattered air-fluid levels with mild small bowel prominence. Colonic gas pattern nonspecific. Findings are most consistent with mild adynamic ileus. Follow-up abdominal series to exclude developing small bowel obstruction should be considered. 2. No acute cardiopulmonary disease.   Electronically Signed   By: Marcello Moores  Register   On: 07/19/2014 12:51     Assessment and Plan:   73 year old gentleman with the following issues:  1. Advanced malignancy with presumed prostate cancer to the bone. I have limited records at this point without any confirmation of this diagnosis. His clinical picture is highly suggestive of metastatic prostate cancer to the bone but certainly he needs an extensive workup. Her workup will include staging imaging studies, repeat PSA and possibly a biopsy. He understands that these steps are critical for making the diagnosis really discussing treatment options and prognosis. He is willing to proceed with dose at this time and we will set that up for him in the near future. Once the diagnosis and been confirmed, we can discuss further treatment options and possible prognosis. If we are operating under the presumption of metastatic prostate cancer, he understands there is no cure for this but we can certainly hope to palliate  his symptoms. Hormone therapy can offer her improvement in his symptoms and possibly extending his overall survival and certainly can improve his quality of life.  The plan at this point is to obtain a CT scan chest abdomen and pelvis and repeat his PSA and we will discuss these results with him in the next visit.  2. Anorexia and weight loss: Regular prescription for Megace to use to boost his appetite.  3. Constipation: He have used multiple agents and I gave him prescription for lactulose to try.  4. Bone directed therapy: His alkaline phosphatase is elevated which most likely represents metastatic prostate cancer to the bone he will benefit likely from bone directed therapy once we have confirmed the diagnosis.  5. Prognosis: Most likely has poor prognosis given his cachexia and poor performance status but we will defer further discussions once the diagnosis has been confirmed.

## 2014-08-17 NOTE — Telephone Encounter (Signed)
gv pt appt schedule for sept. central will call re ct - pt/wife aware. f/u scheduled 9/17 instead of 9/18 as requested per 9/2 pof. - pt has conflict T/6/22.

## 2014-08-17 NOTE — Progress Notes (Signed)
Please see consult note.  

## 2014-08-18 LAB — PSA: PSA: 723.7 ng/mL — AB (ref <=4.00)

## 2014-08-18 LAB — TESTOSTERONE: Testosterone: 159 ng/dL — ABNORMAL LOW (ref 300–890)

## 2014-08-23 ENCOUNTER — Encounter (HOSPITAL_COMMUNITY): Payer: Self-pay

## 2014-08-23 ENCOUNTER — Ambulatory Visit (HOSPITAL_COMMUNITY)
Admission: RE | Admit: 2014-08-23 | Discharge: 2014-08-23 | Disposition: A | Discharge status: Home / Self Care | Payer: Medicare Other | Source: Ambulatory Visit | Attending: Oncology | Admitting: Oncology

## 2014-08-23 DIAGNOSIS — R109 Unspecified abdominal pain: Secondary | ICD-10-CM | POA: Insufficient documentation

## 2014-08-23 DIAGNOSIS — C61 Malignant neoplasm of prostate: Secondary | ICD-10-CM | POA: Insufficient documentation

## 2014-08-23 DIAGNOSIS — C7952 Secondary malignant neoplasm of bone marrow: Secondary | ICD-10-CM

## 2014-08-23 DIAGNOSIS — J438 Other emphysema: Secondary | ICD-10-CM | POA: Insufficient documentation

## 2014-08-23 DIAGNOSIS — C778 Secondary and unspecified malignant neoplasm of lymph nodes of multiple regions: Secondary | ICD-10-CM | POA: Insufficient documentation

## 2014-08-23 DIAGNOSIS — N400 Enlarged prostate without lower urinary tract symptoms: Secondary | ICD-10-CM | POA: Insufficient documentation

## 2014-08-23 DIAGNOSIS — C7951 Secondary malignant neoplasm of bone: Secondary | ICD-10-CM | POA: Insufficient documentation

## 2014-08-23 MED ORDER — IOHEXOL 300 MG/ML  SOLN
80.0000 mL | Freq: Once | INTRAMUSCULAR | Status: AC | PRN
  Administered 2014-08-23: 80 mL via INTRAVENOUS

## 2014-09-01 ENCOUNTER — Ambulatory Visit (HOSPITAL_BASED_OUTPATIENT_CLINIC_OR_DEPARTMENT_OTHER): Payer: Medicare Other | Admitting: Oncology

## 2014-09-01 ENCOUNTER — Encounter: Payer: Self-pay | Admitting: Oncology

## 2014-09-01 ENCOUNTER — Telehealth: Payer: Self-pay | Admitting: Oncology

## 2014-09-01 VITALS — BP 114/54 | HR 111 | Temp 97.4°F | Resp 18 | Ht 72.0 in | Wt 120.2 lb

## 2014-09-01 DIAGNOSIS — C7952 Secondary malignant neoplasm of bone marrow: Secondary | ICD-10-CM

## 2014-09-01 DIAGNOSIS — C7951 Secondary malignant neoplasm of bone: Secondary | ICD-10-CM

## 2014-09-01 DIAGNOSIS — C61 Malignant neoplasm of prostate: Secondary | ICD-10-CM

## 2014-09-01 DIAGNOSIS — R1310 Dysphagia, unspecified: Secondary | ICD-10-CM

## 2014-09-01 DIAGNOSIS — G893 Neoplasm related pain (acute) (chronic): Secondary | ICD-10-CM

## 2014-09-01 MED ORDER — OXYCODONE HCL 5 MG PO TABS: 5.0000 mg | ORAL_TABLET | ORAL | Status: AC | PRN

## 2014-09-01 MED ORDER — LEUPROLIDE ACETATE (3 MONTH) 22.5 MG IM KIT
22.5000 mg | PACK | Freq: Once | INTRAMUSCULAR | Status: AC
  Administered 2014-09-01: 22.5 mg via INTRAMUSCULAR
  Filled 2014-09-01: qty 22.5

## 2014-09-01 MED ORDER — BICALUTAMIDE 50 MG PO TABS
50.0000 mg | ORAL_TABLET | Freq: Every day | ORAL | Status: AC
Start: 2014-09-01 — End: ?

## 2014-09-01 NOTE — Progress Notes (Signed)
Hematology and Oncology Follow Up Visit  Barry Macias [id number] [birth year] 73 y.o. 09/01/2014 4:22 PM No PCP Per PatientNo ref. provider found   Principle Diagnosis: 73 year old gentleman with metastatic prostate cancer diagnosed in September of 2015. He presented with a PSA of 723.7 and widely metastatic bony disease and lymphadenopathy.   Prior Therapy: None.  Current therapy: Combined androgen deprivation with Lupron and Casodex to start in September of 2015.  Interim History:  Barry Macias returns today for a followup visit. He is a gentleman I saw in consultation on 08/17/2014 for advanced malignancy. Since his last visit, he had a CT scan which showed widespread metastasis of his prostate cancer with an enlarged prostate. As his last visit he continues to do poorly. He has poor by mouth intake and dysphagia. He feels it was getting stuck in the middle of his esophagus. He is able to drink a reasonable amount of fluids however. He continues to have diffuse pain throughout his abdomen and pelvis. He continues to lose weight and have poor performance status. He does not report any fevers or chills or sweats. He has not had any headaches or blurry vision or syncope. His mobility is limited but is able to walk around with a cane but he is a fall risk. He is also reporting any chest pain or palpitations or any leg edema. Does not report any shortness of breath, cough or hemoptysis. He did not report any nausea, vomiting, abdominal pain but does report constipation. He does report urinary symptoms including retention and dysuria at times. He does not report any lymphadenopathy or petechiae. He does report diffuse bone pain predominantly in his hips and his back. Rest of his review of systems unremarkable.    Medications: I have reviewed the patient's current medications.  Current Outpatient Prescriptions  Medication Sig Dispense Refill  . atorvastatin (LIPITOR) 40 MG tablet Take 40 mg by mouth  daily.      . clopidogrel (PLAVIX) 75 MG tablet Take 75 mg by mouth daily.      Marland Kitchen HYDROcodone-acetaminophen (NORCO/VICODIN) 5-325 MG per tablet Take 1 tablet by mouth every 4 (four) hours as needed for moderate pain or severe pain.  20 tablet  0  . magnesium citrate SOLN Take 1 Bottle by mouth daily.      . bicalutamide (CASODEX) 50 MG tablet Take 1 tablet (50 mg total) by mouth daily.  30 tablet  0  . docusate sodium (COLACE) 100 MG capsule Take 100 mg by mouth daily.      Marland Kitchen lactulose (CHRONULAC) 10 GM/15ML solution Take 15 mLs (10 g total) by mouth 3 (three) times daily as needed for mild constipation.  240 mL  0  . megestrol (MEGACE) 400 MG/10ML suspension Take 10 mLs (400 mg total) by mouth 2 (two) times daily.  240 mL  0  . oxyCODONE (OXY IR/ROXICODONE) 5 MG immediate release tablet Take 1 tablet (5 mg total) by mouth every 4 (four) hours as needed for severe pain.  30 tablet  0  . polyethylene glycol (MIRALAX / GLYCOLAX) packet Take 17 g by mouth daily as needed for moderate constipation.      . senna (SENOKOT) 8.6 MG tablet Take 1 tablet by mouth daily.       No current facility-administered medications for this visit.     Allergies: No Known Allergies  Past Medical History, Surgical history, Social history, and Family History were reviewed and updated.   Physical Exam: Blood pressure 114/54, pulse 111,  temperature 97.4 F (36.3 C), temperature source Oral, resp. rate 18, height 6' (1.829 m), weight 120 lb 3.2 oz (54.522 kg), SpO2 98.00%. ECOG: 2 General appearance: alert and cooperative cachectic appearing. Head: Normocephalic, without obvious abnormality Neck: no adenopathy Lymph nodes: Cervical, supraclavicular, and axillary nodes normal. Heart:regular rate and rhythm, S1, S2 normal, no murmur, click, rub or gallop Lung:chest clear, no wheezing, rales, normal symmetric air entry Abdomin: soft, non-tender, without masses or organomegaly EXT:no erythema, induration, or  nodules   Lab Results: Lab Results  Component Value Date   WBC 5.1 08/17/2014   HGB 9.7* 08/17/2014   HCT 31.7* 08/17/2014   MCV 83.4 08/17/2014   PLT 405* 08/17/2014     Chemistry      Component Value Date/Time   NA 140 08/17/2014 1039   NA 135* 08/09/2014 0248   K 3.9 08/17/2014 1039   K 4.4 08/09/2014 0248   CL 96 08/09/2014 0248   CO2 24 08/17/2014 1039   CO2 25 08/09/2014 0248   BUN 7.3 08/17/2014 1039   BUN 10 08/09/2014 0248   CREATININE 0.8 08/17/2014 1039   CREATININE 0.75 08/09/2014 0248      Component Value Date/Time   CALCIUM 9.1 08/17/2014 1039   CALCIUM 8.7 08/09/2014 0248   ALKPHOS 265* 08/17/2014 1039   ALKPHOS 280* 08/09/2014 0248   AST 29 08/17/2014 1039   AST 38* 08/09/2014 0248   ALT 14 08/17/2014 1039   ALT 12 08/09/2014 0248   BILITOT 0.22 08/17/2014 1039   BILITOT 0.3 08/09/2014 0248     Results for THEOPHIL, THIVIERGE (MRN [id number]) as of 09/01/2014 15:48  Ref. Range 08/17/2014 10:52  PSA Latest Range: <=4.00 ng/mL 723.70 (H)    Radiological Studies:  EXAM:  CT CHEST, ABDOMEN, AND PELVIS WITH CONTRAST  TECHNIQUE:  Multidetector CT imaging of the chest, abdomen and pelvis was  performed following the standard protocol during bolus  administration of intravenous contrast.  CONTRAST: 20mL OMNIPAQUE IOHEXOL 300 MG/ML SOLN  COMPARISON: None.  FINDINGS:  CT CHEST FINDINGS  Mediastinum/Hilar Regions: No masses or pathologically enlarged  lymph nodes identified.  Other Thoracic Lymphadenopathy: 11 mm right retrocrural lymph node  seen on image 55.  Lungs: No pulmonary infiltrate or mass identified. Mild emphysema  noted.  Pleura: No evidence of effusion or mass.  Vascular/Cardiac: No thoracic aortic aneurysm or other significant  abnormality identified.  Musculoskeletal: Diffuse sclerotic bone metastases seen throughout  the thorax.  Other: None.  CT ABDOMEN AND PELVIS FINDINGS  Liver: Multiple tiny hepatic cysts are noted, but no definite liver  masses are identified.   Gallbladder/Biliary: Unremarkable.  Pancreas: No mass, inflammatory changes, or other parenchymal  abnormality identified.  Spleen: Within normal limits in size and appearance.  Adrenal Glands: No mass identified.  Kidneys/Urinary Tract: No masses identified. No evidence of  hydronephrosis.  Lymph Nodes: Mild retroperitoneal lymphadenopathy is seen in the  paraaortic and pericaval spaces, with index node measuring 14 mm  near the aortic bifurcation on image 85. Lymphadenopathy also seen  in the common iliac chains, right side greater than left, and in the  right external iliac chain. Largest index lymph node in the right  common iliac chain measures 2.4 cm in short axis on image 92. Mild  right inguinal lymphadenopathy is also seen with lymph nodes  measuring up to 10 mm.  Pelvic/Reproductive Organs: Mildly enlarged prostate gland seen with  mass effect on bladder base. Seminal vesicles appear relatively  symmetric.  Bowel/Peritoneum: No  evidence of bowel wall thickening, mass, or  obstruction.  Vascular: No evidence of abdominal aortic aneurysm.  Musculoskeletal: Diffuse sclerotic bone metastases seen involving  the lumbar spine and pelvis.  Other: None.  IMPRESSION:  Mildly enlarged prostate gland.  Metastatic lymphadenopathy in the pelvis, abdominal retroperitoneum,  and right retrocrural region.  Diffuse sclerotic bone metastases.  Emphysema.  Impression and Plan:   73 year old gentleman with the following issues:  1. Advanced prostate cancer with metastatic disease to the bone and lymphadenopathy. His PSA results as well as a CT scan were discussed today and clearly shows progressive disease that is clearly debilitating him. The natural course of this disease was discussed and treatment options were reviewed today. Any treatments moving forward would be merely palliative giving the advanced nature of his disease and cure is not an option at this point. I believe combined  androgen deprivation with Lupron and Casodex as his best choice in controlling his disease process. Complications from this treatments were discussed today. These would include hot flashes, loss of muscle mass, osteoporosis, and others. The benefits would be substantial including palliation of his symptoms and improving his quality of life. I think you will have a reasonable response but will likely will be short-lived. He'll receive Lupron today 22.5 mg and will be repeated in 3 months.  2. Bone directed therapy: He will likely benefit from [facility] but that will be added at a later date.  3. Diffuse pain: I gave her prescription for oxycodone today for pain relief.  4. Dysphagia: His CT scan did not show any clear evidence of extrinsic compression but certainly will benefit from an endoscopy on at least from a GI evaluation. I will refer him to gastroenterology   [facility], MD 9/17/20154:22 PM

## 2014-09-01 NOTE — Telephone Encounter (Signed)
gv adn printed appts sched and avs fo rpt for Dec..Marland KitchenMarland KitchenRudolpho Macias is in staff meeting....will call pt with appt.

## 2014-09-02 ENCOUNTER — Telehealth: Payer: Self-pay | Admitting: Oncology

## 2014-09-02 NOTE — Telephone Encounter (Signed)
Pt sched with Dr. Henrene Pastor 11.20 @ 9:30 per is aware

## 2014-09-16 ENCOUNTER — Ambulatory Visit: Payer: Medicare Other | Admitting: Physician Assistant

## 2014-11-04 ENCOUNTER — Ambulatory Visit: Payer: Medicare Other | Admitting: Internal Medicine

## 2014-11-24 ENCOUNTER — Other Ambulatory Visit: Payer: Medicare Other

## 2014-11-24 ENCOUNTER — Ambulatory Visit: Payer: Medicare Other | Admitting: Oncology

## 2014-11-24 ENCOUNTER — Ambulatory Visit: Payer: Medicare Other

## 2015-03-04 IMAGING — CR DG ABDOMEN ACUTE W/ 1V CHEST
3 series · 3 of 3 positions shown · non-contrast
Comparison: None.

CLINICAL DATA: Pain.

EXAM:
ACUTE ABDOMEN SERIES (ABDOMEN 2 VIEW & CHEST 1 VIEW)

[w chest pa]
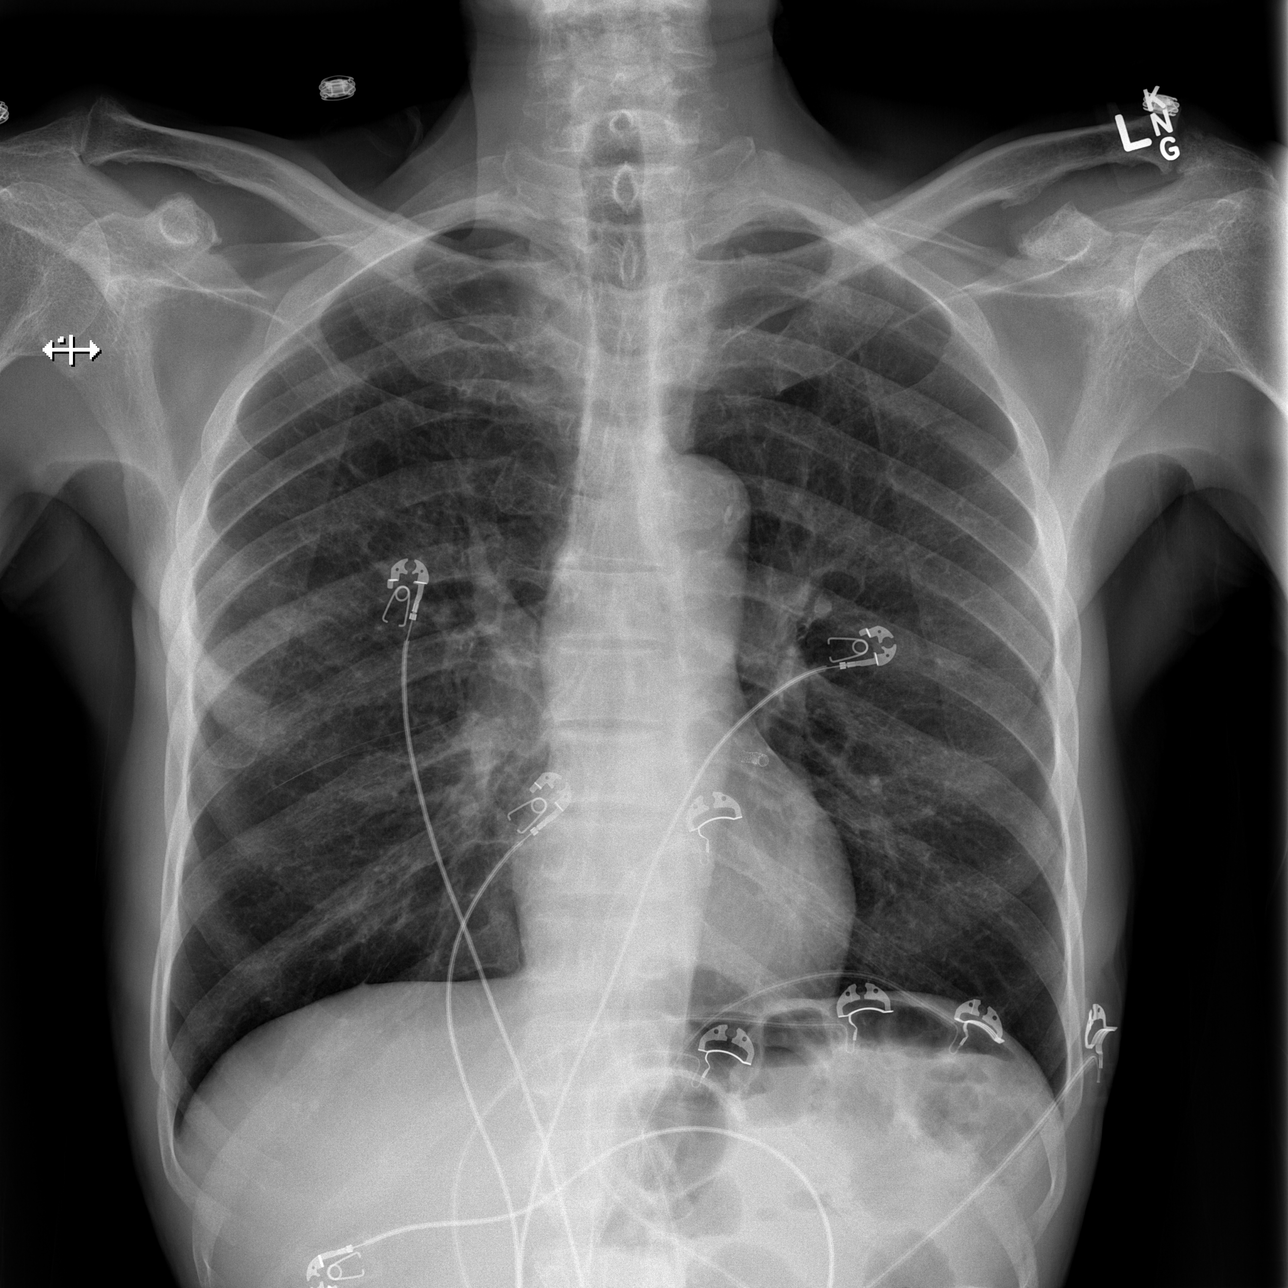

[w abdomen upright]
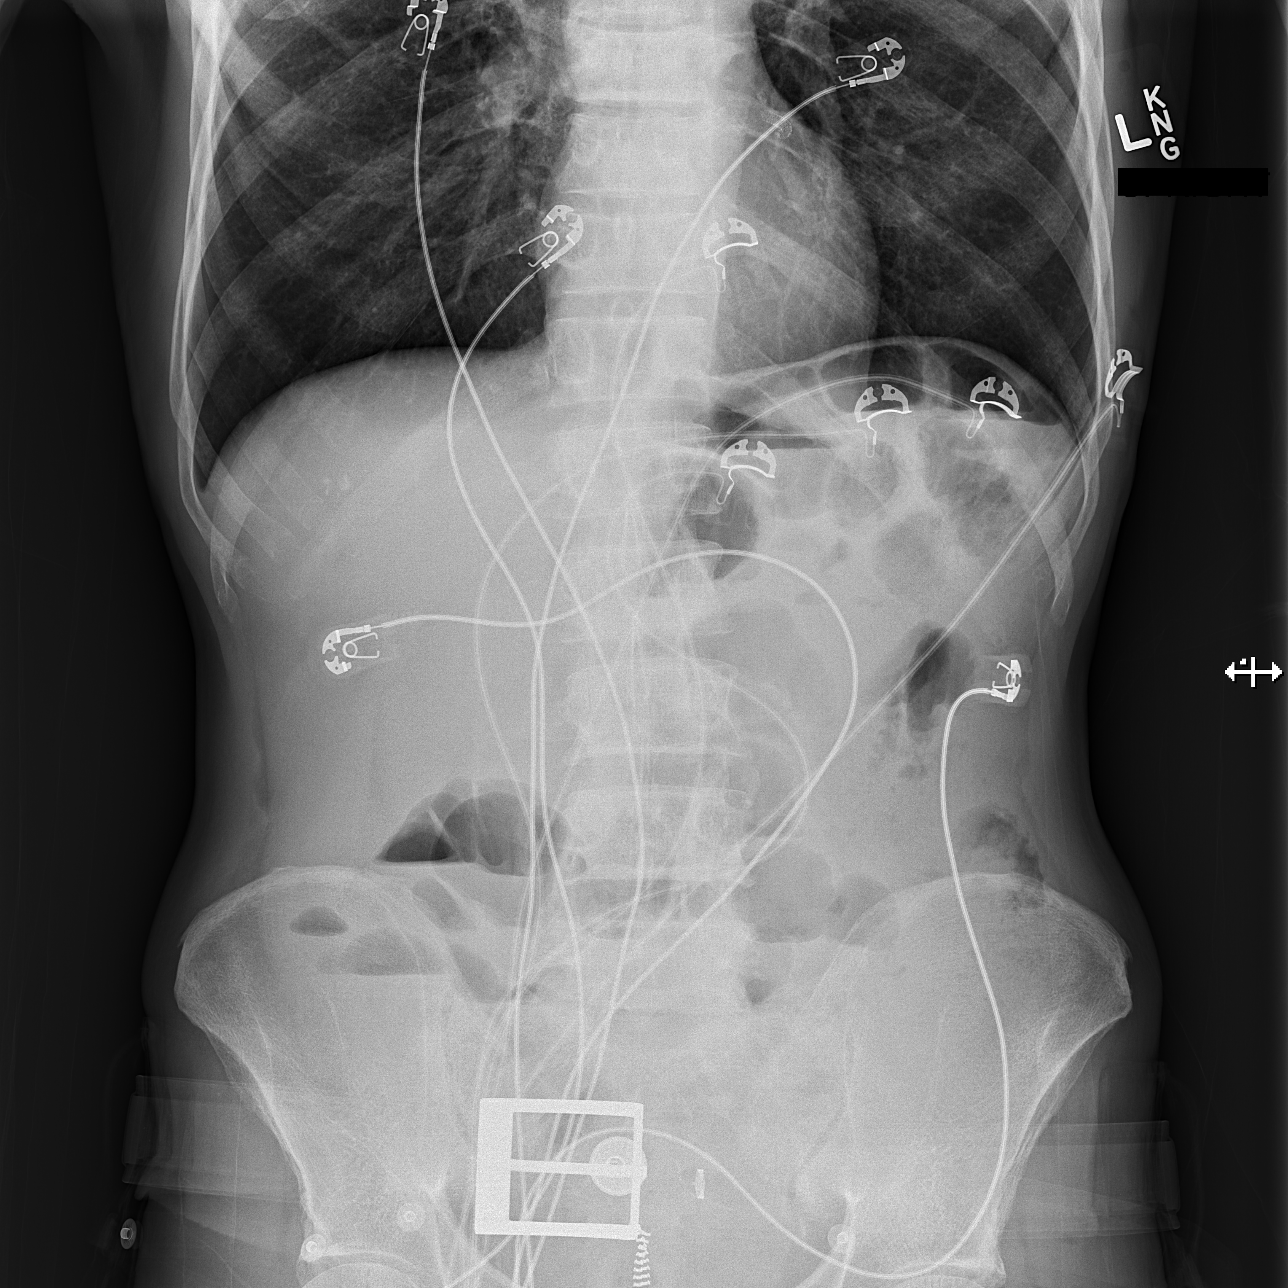

[t abdomen supine]
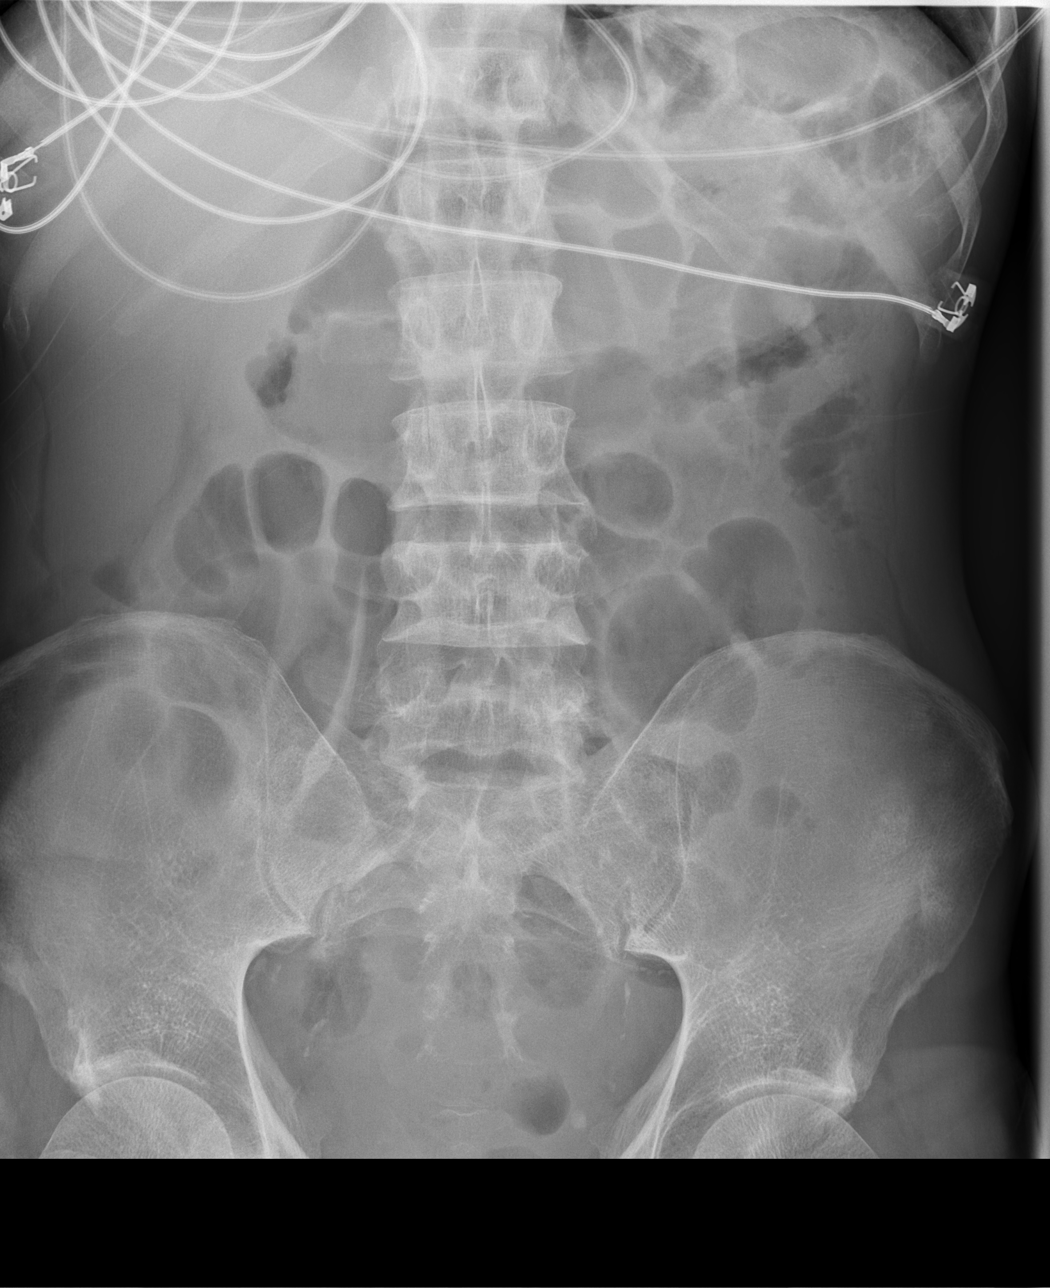

[3 of 3 positions shown; findings below may reference images not displayed]

FINDINGS: No acute cardiopulmonary disease. Soft tissue structures are
unremarkable. Scattered air-fluid levels are noted throughout the
abdomen. Air-filled minimally prominent loops of small bowel are
noted. Colonic gas pattern is nonspecific. No free air.
Calcifications within the pelvis consistent with phleboliths.
Degenerative changes lumbar spine and both hips.
IMPRESSION: 1. Scattered air-fluid levels with mild small bowel prominence.
Colonic gas pattern nonspecific. Findings are most consistent with
mild adynamic ileus. Follow-up abdominal series to exclude
developing small bowel obstruction should be considered.
2. No acute cardiopulmonary disease.
